# Patient Record
Sex: Male | Born: 1990 | Race: Black or African American | Hispanic: No | Marital: Single | Smoking: Never smoker
Health system: Southern US, Community
[De-identification: ages and names within clinical notes are randomized; demographics above are authoritative.]

---
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
## 2009-01-20 ENCOUNTER — Ambulatory Visit: Payer: Self-pay | Admitting: Internal Medicine

## 2009-01-20 DIAGNOSIS — F909 Attention-deficit hyperactivity disorder, unspecified type: Secondary | ICD-10-CM | POA: Insufficient documentation

## 2009-01-20 DIAGNOSIS — IMO0002 Reserved for concepts with insufficient information to code with codable children: Secondary | ICD-10-CM

## 2009-01-20 DIAGNOSIS — H547 Unspecified visual loss: Secondary | ICD-10-CM | POA: Insufficient documentation

## 2009-01-20 DIAGNOSIS — F79 Unspecified intellectual disabilities: Secondary | ICD-10-CM

## 2009-01-20 LAB — CONVERTED CEMR LAB
ALT: 21 units/L (ref 0–53)
AST: 31 units/L (ref 0–37)
Albumin: 4 g/dL (ref 3.5–5.2)
Alkaline Phosphatase: 83 units/L (ref 39–117)
BUN: 22 mg/dL (ref 6–23)
Basophils Absolute: 0 10*3/uL (ref 0.0–0.1)
Basophils Relative: 0 % (ref 0–1)
Bilirubin Urine: NEGATIVE
Blood in Urine, dipstick: NEGATIVE
CO2: 25 meq/L (ref 19–32)
Calcium: 9.2 mg/dL (ref 8.4–10.5)
Chloride: 104 meq/L (ref 96–112)
Cholesterol: 166 mg/dL (ref 0–169)
Creatinine, Ser: 0.99 mg/dL (ref 0.40–1.50)
Eosinophils Absolute: 0.2 10*3/uL (ref 0.0–0.7)
Eosinophils Relative: 3 % (ref 0–5)
Glucose, Bld: 83 mg/dL (ref 70–99)
Glucose, Urine, Semiquant: NEGATIVE
HCT: 39.4 % (ref 39.0–52.0)
HDL: 59 mg/dL (ref >34)
Hemoglobin: 13 g/dL (ref 13.0–17.0)
LDL Cholesterol: 72 mg/dL (ref 0–109)
Lymphocytes Relative: 33 % (ref 12–46)
Lymphs Abs: 1.6 10*3/uL (ref 0.7–4.0)
MCHC: 33 g/dL (ref 30.0–36.0)
MCV: 83.5 fL (ref 78.0–100.0)
Monocytes Absolute: 0.4 10*3/uL (ref 0.1–1.0)
Monocytes Relative: 9 % (ref 3–12)
Neutro Abs: 2.6 10*3/uL (ref 1.7–7.7)
Neutrophils Relative %: 54 % (ref 43–77)
Nitrite: NEGATIVE
Platelets: 130 10*3/uL — ABNORMAL LOW (ref 150–400)
Potassium: 4.6 meq/L (ref 3.5–5.3)
Protein, U semiquant: NEGATIVE
RBC: 4.72 M/uL (ref 4.22–5.81)
RDW: 13.7 % (ref 11.5–15.5)
Sodium: 138 meq/L (ref 135–145)
Specific Gravity, Urine: 1.015
Total Bilirubin: 0.4 mg/dL (ref 0.3–1.2)
Total CHOL/HDL Ratio: 2.8
Total Protein: 7.4 g/dL (ref 6.0–8.3)
Triglycerides: 174 mg/dL — ABNORMAL HIGH (ref <150)
Urobilinogen, UA: 1
VLDL: 35 mg/dL (ref 0–40)
WBC Urine, dipstick: NEGATIVE
WBC: 4.8 10*3/uL (ref 4.0–10.5)
pH: 7

## 2009-01-31 ENCOUNTER — Encounter (INDEPENDENT_AMBULATORY_CARE_PROVIDER_SITE_OTHER): Payer: Self-pay | Admitting: Internal Medicine

## 2009-06-19 ENCOUNTER — Telehealth (INDEPENDENT_AMBULATORY_CARE_PROVIDER_SITE_OTHER): Payer: Self-pay | Admitting: Internal Medicine

## 2009-06-20 ENCOUNTER — Emergency Department (HOSPITAL_COMMUNITY): Admission: EM | Admit: 2009-06-20 | Discharge: 2009-06-20 | Payer: Self-pay | Admitting: Emergency Medicine

## 2009-07-05 ENCOUNTER — Encounter (INDEPENDENT_AMBULATORY_CARE_PROVIDER_SITE_OTHER): Payer: Self-pay | Admitting: Internal Medicine

## 2010-04-03 ENCOUNTER — Ambulatory Visit: Payer: Self-pay | Admitting: Internal Medicine

## 2010-04-05 ENCOUNTER — Ambulatory Visit: Payer: Self-pay | Admitting: Internal Medicine

## 2010-04-27 ENCOUNTER — Emergency Department (HOSPITAL_COMMUNITY): Admission: EM | Admit: 2010-04-27 | Discharge: 2010-04-27 | Payer: Self-pay | Admitting: Emergency Medicine

## 2010-07-13 ENCOUNTER — Ambulatory Visit: Admit: 2010-07-13 | Payer: Self-pay | Admitting: Internal Medicine

## 2010-08-02 NOTE — Letter (Signed)
Summary: LETTERS OF APPOINTMENT GUARDIAN OF THE PERSON  LETTERS OF APPOINTMENT GUARDIAN OF THE PERSON   Imported By: Arta Bruce 04/03/2010 09:58:29  _____________________________________________________________________  External Attachment:    Type:   Image     Comment:   External Document

## 2010-08-02 NOTE — Letter (Signed)
Summary: LETTER OF APPOINTMENT GUARDIAN OF THE PERSON  LETTER OF APPOINTMENT GUARDIAN OF THE PERSON   Imported By: Arta Bruce 07/09/2010 15:29:59  _____________________________________________________________________  External Attachment:    Type:   Image     Comment:   External Document

## 2010-08-28 ENCOUNTER — Telehealth (INDEPENDENT_AMBULATORY_CARE_PROVIDER_SITE_OTHER): Payer: Self-pay | Admitting: Internal Medicine

## 2010-09-12 ENCOUNTER — Encounter (INDEPENDENT_AMBULATORY_CARE_PROVIDER_SITE_OTHER): Payer: Self-pay | Admitting: Internal Medicine

## 2010-09-18 NOTE — Letter (Signed)
Summary: Generic Letter  [facility]  [address] [place], [place] [address]   Phone: [phone]  Fax: [phone]      09/12/2010  Demetria Powell-Harrison, SWPS Guardianship, Adult [facility] Department of Social Services [address] [place], [place]  [address]  Re:  Kyle Luppino, DOB  1991/05/30  Dear Ms. Powell-Harrison,  I have made several attempts to contact you by telephone regarding Mr. Kluth.    Per Dr. Julieanne Manson, she has not seen this patient since 01/20/2009 and cannot fill out the "Adult Care Home Personal Care Physician Authorization and Care Plan" forms.  If he needs this done, he needs an office visit and the paperwork needs to be brought back at that time for completion.  He should also be seen yearly for a complete physical examination.   Please contact us if you have any further questions or to set up an appointment for Mr. Bollard.  Sincerely,   Dutch Quint RN

## 2010-09-18 NOTE — Progress Notes (Signed)
Summary: Form unable to be completed  Phone Note Other Incoming   Summary of Call: Received Adult Care Home Personal Care physician authorization and care plan paperwork from [facility], SWPS.  I have not see Tammy since  01/20/2009 and cannot fill out form. If needs this done--needs OV and to bring paperwork back at time of visit. He should be seen yearly for a physical. They should come pick up the paperwork so it does not get lost. Initial call taken by: Julieanne Manson MD,  August 28, 2010 11:46 AM  Follow-up for Phone Call        Called Ms. Powell-Harrison at [phone].  Left message on answering machine for her to return call. Dutch Quint RN  September 03, 2010 12:14 PM  Called Ms. Powell-Harrison at [phone].  Left message on answering machine for her to return call.  Dutch Quint RN  September 04, 2010 1:00 PM  Called and left message on answering machine for a supervisor, Marin Comment - [phone] to return my call.  Dutch Quint RN  September 10, 2010 12:43 PM  I have called several times, no response.  Do you want his forms mailed back to them?  Dutch Quint RN  September 11, 2010 2:40 PM  Yes with my instructions above.  Julieanne Manson MD  September 11, 2010 2:41 PM   Noted.  Letter sent with forms.  Dutch Quint RN  September 12, 2010 3:49 PM

## 2010-10-01 LAB — RAPID URINE DRUG SCREEN, HOSP PERFORMED
Amphetamines: NOT DETECTED
Barbiturates: NOT DETECTED
Benzodiazepines: NOT DETECTED
Opiates: NOT DETECTED

## 2010-10-01 LAB — ETHANOL: Alcohol, Ethyl (B): <5 mg/dL (ref 0–10)

## 2010-10-01 LAB — COMPREHENSIVE METABOLIC PANEL
BUN: 16 mg/dL (ref 6–23)
Calcium: 9.2 mg/dL (ref 8.4–10.5)
Glucose, Bld: 102 mg/dL — ABNORMAL HIGH (ref 70–99)
Sodium: 140 mEq/L (ref 135–145)
Total Protein: 7.5 g/dL (ref 6.0–8.3)

## 2010-10-01 LAB — DIFFERENTIAL
Basophils Absolute: 0 10*3/uL (ref 0.0–0.1)
Basophils Relative: 1 % (ref 0–1)
Eosinophils Absolute: 0.1 10*3/uL (ref 0.0–0.7)
Eosinophils Relative: 2 % (ref 0–5)
Lymphocytes Relative: 31 % (ref 12–46)
Lymphs Abs: 1.5 10*3/uL (ref 0.7–4.0)
Monocytes Absolute: 0.5 10*3/uL (ref 0.1–1.0)
Monocytes Relative: 11 % (ref 3–12)
Neutro Abs: 2.6 10*3/uL (ref 1.7–7.7)
Neutrophils Relative %: 55 % (ref 43–77)

## 2010-10-01 LAB — URINALYSIS, ROUTINE W REFLEX MICROSCOPIC
Glucose, UA: NEGATIVE mg/dL
Hgb urine dipstick: NEGATIVE
Leukocytes, UA: NEGATIVE
Nitrite: NEGATIVE
Specific Gravity, Urine: 1.034 — ABNORMAL HIGH (ref 1.005–1.030)
Urobilinogen, UA: 2 mg/dL — ABNORMAL HIGH (ref 0.0–1.0)
pH: 7 (ref 5.0–8.0)

## 2010-10-01 LAB — URINE MICROSCOPIC-ADD ON

## 2010-10-01 LAB — CBC
Hemoglobin: 12.7 g/dL — ABNORMAL LOW (ref 13.0–17.0)
MCHC: 33.2 g/dL (ref 30.0–36.0)
Platelets: 129 10*3/uL — ABNORMAL LOW (ref 150–400)
RDW: 12.6 % (ref 11.5–15.5)

## 2010-10-09 LAB — URINALYSIS, ROUTINE W REFLEX MICROSCOPIC
Bilirubin Urine: NEGATIVE
Glucose, UA: NEGATIVE mg/dL
Hgb urine dipstick: NEGATIVE
Ketones, ur: NEGATIVE mg/dL
Nitrite: NEGATIVE
Protein, ur: NEGATIVE mg/dL
Specific Gravity, Urine: 1.024 (ref 1.005–1.030)
Urobilinogen, UA: 2 mg/dL — ABNORMAL HIGH (ref 0.0–1.0)

## 2010-10-09 LAB — CBC
HCT: 40.3 % (ref 39.0–52.0)
HCT: 40.3 % (ref 39.0–52.0)
Hemoglobin: 13.5 g/dL (ref 13.0–17.0)
Hemoglobin: 13.6 g/dL (ref 13.0–17.0)
MCHC: 33.7 g/dL (ref 30.0–36.0)
MCV: 84 fL (ref 78.0–100.0)
MCV: 84 fL (ref 78.0–100.0)
Platelets: 107 10*3/uL — ABNORMAL LOW (ref 150–400)
RDW: 13.4 % (ref 11.5–15.5)
RDW: 13.7 % (ref 11.5–15.5)
WBC: 12.9 10*3/uL — ABNORMAL HIGH (ref 4.0–10.5)

## 2010-10-09 LAB — DIFFERENTIAL
Basophils Absolute: 0 10*3/uL (ref 0.0–0.1)
Basophils Relative: 0 % (ref 0–1)
Basophils Relative: 0 % (ref 0–1)
Eosinophils Absolute: 0.2 10*3/uL (ref 0.0–0.7)
Monocytes Absolute: 0.5 10*3/uL (ref 0.1–1.0)
Monocytes Absolute: 0.6 10*3/uL (ref 0.1–1.0)
Monocytes Relative: 5 % (ref 3–12)
Neutro Abs: 10.8 10*3/uL — ABNORMAL HIGH (ref 1.7–7.7)
Neutrophils Relative %: 84 % — ABNORMAL HIGH (ref 43–77)

## 2010-10-09 LAB — RAPID URINE DRUG SCREEN, HOSP PERFORMED
Amphetamines: NOT DETECTED
Amphetamines: NOT DETECTED
Barbiturates: NOT DETECTED
Benzodiazepines: NOT DETECTED
Benzodiazepines: NOT DETECTED
Cocaine: NOT DETECTED
Opiates: NOT DETECTED
Tetrahydrocannabinol: NOT DETECTED

## 2010-10-09 LAB — POCT I-STAT, CHEM 8
BUN: 6 mg/dL (ref 6–23)
BUN: 7 mg/dL (ref 6–23)
BUN: 7 mg/dL (ref 6–23)
Calcium, Ion: 1.02 mmol/L — ABNORMAL LOW (ref 1.12–1.32)
Calcium, Ion: 1.13 mmol/L (ref 1.12–1.32)
Calcium, Ion: 1.18 mmol/L (ref 1.12–1.32)
Chloride: 107 mEq/L (ref 96–112)
Chloride: 111 mEq/L (ref 96–112)
Chloride: 115 mEq/L — ABNORMAL HIGH (ref 96–112)
Creatinine, Ser: 1.1 mg/dL (ref 0.4–1.5)
Creatinine, Ser: 1.1 mg/dL (ref 0.4–1.5)
Glucose, Bld: 105 mg/dL — ABNORMAL HIGH (ref 70–99)
Glucose, Bld: 108 mg/dL — ABNORMAL HIGH (ref 70–99)
Glucose, Bld: 87 mg/dL (ref 70–99)
HCT: 48 % (ref 39.0–52.0)
HCT: 49 % (ref 39.0–52.0)
Hemoglobin: 16.3 g/dL (ref 13.0–17.0)
Hemoglobin: 16.7 g/dL (ref 13.0–17.0)
Potassium: 3.7 mEq/L (ref 3.5–5.1)
Potassium: 4 mEq/L (ref 3.5–5.1)
Potassium: 4.1 mEq/L (ref 3.5–5.1)
Sodium: 144 mEq/L (ref 135–145)
Sodium: 146 mEq/L — ABNORMAL HIGH (ref 135–145)
TCO2: 28 mmol/L (ref 0–100)
TCO2: 28 mmol/L (ref 0–100)

## 2010-10-09 LAB — T4, FREE: Free T4: 0.83 ng/dL (ref 0.80–1.80)

## 2010-10-09 LAB — CSF CELL COUNT WITH DIFFERENTIAL
RBC Count, CSF: 52 /mm3 — ABNORMAL HIGH
Tube #: 1

## 2010-10-09 LAB — HSV PCR: HSV, PCR: NOT DETECTED

## 2010-10-09 LAB — BASIC METABOLIC PANEL
CO2: 25 mEq/L (ref 19–32)
Calcium: 8.8 mg/dL (ref 8.4–10.5)
Chloride: 109 mEq/L (ref 96–112)
Glucose, Bld: 102 mg/dL — ABNORMAL HIGH (ref 70–99)
Potassium: 4 mEq/L (ref 3.5–5.1)
Sodium: 138 mEq/L (ref 135–145)

## 2010-10-09 LAB — GLUCOSE, CAPILLARY
Glucose-Capillary: 102 mg/dL — ABNORMAL HIGH (ref 70–99)
Glucose-Capillary: 79 mg/dL (ref 70–99)

## 2010-10-09 LAB — ETHANOL
Alcohol, Ethyl (B): <5 mg/dL (ref 0–10)
Alcohol, Ethyl (B): <5 mg/dL (ref 0–10)

## 2010-10-09 LAB — PROTEIN AND GLUCOSE, CSF
Glucose, CSF: 56 mg/dL (ref 43–76)
Total  Protein, CSF: 29 mg/dL (ref 15–45)

## 2010-10-09 LAB — COMPREHENSIVE METABOLIC PANEL
ALT: 16 U/L (ref 0–53)
Albumin: 3.3 g/dL — ABNORMAL LOW (ref 3.5–5.2)
Alkaline Phosphatase: 71 U/L (ref 39–117)
Alkaline Phosphatase: 72 U/L (ref 39–117)
BUN: 7 mg/dL (ref 6–23)
CO2: 23 mEq/L (ref 19–32)
Chloride: 112 mEq/L (ref 96–112)
GFR calc Af Amer: >60 mL/min (ref >60)
GFR calc non Af Amer: >60 mL/min (ref >60)
Glucose, Bld: 102 mg/dL — ABNORMAL HIGH (ref 70–99)
Potassium: 4.1 mEq/L (ref 3.5–5.1)
Potassium: 4.4 mEq/L (ref 3.5–5.1)
Sodium: 144 mEq/L (ref 135–145)
Total Bilirubin: 1.2 mg/dL (ref 0.3–1.2)
Total Protein: 6.3 g/dL (ref 6.0–8.3)

## 2010-10-09 LAB — VALPROIC ACID LEVEL
Valproic Acid Lvl: 138.5 ug/mL — ABNORMAL HIGH (ref 50.0–100.0)
Valproic Acid Lvl: 59.7 ug/mL (ref 50.0–100.0)
Valproic Acid Lvl: 98.1 ug/mL (ref 50.0–100.0)

## 2010-10-09 LAB — CSF CULTURE W GRAM STAIN: Gram Stain: NONE SEEN

## 2010-10-09 LAB — ACETAMINOPHEN LEVEL: Acetaminophen (Tylenol), Serum: <10 ug/mL — ABNORMAL LOW (ref 10–30)

## 2010-10-09 LAB — HEPATITIS PANEL, ACUTE
Hep A IgM: NEGATIVE
Hep B C IgM: NEGATIVE

## 2010-11-13 NOTE — Procedures (Signed)
EEG NUMBER:  [id number].   CLINICAL HISTORY:  An 20 year old mentally retarded male being evaluated  for aggressive behavior and altered mental status.   MEDICATIONS:  Depakote, aripiprazole, Lovenox, Protonix, Zofran.   This is a portable EEG recorded with the patient awake and drowsy using  standard 10/20 electrode placement.   Background awake rhythm consists of 4-5 Hz theta which is diffusely  slow, symmetric.  Intermittent 3-4 Hz transient delta slowing is seen  bilaterally in a diffuse distribution.  No paroxysmal and epileptiform  activities, spikes or sharp waves are noted.  Changes of drowsiness are  achieved towards the end of the tracing.  The length of the recording is  21.9 minutes.  Technical component is adequate.  EKG tracing reveals  regular sinus rhythm.  Hyperventilation and photic stimulation are not  performed this being a portable study.   IMPRESSION:  This EEG performed during awake and drowsy state is  abnormal due to presence of moderate bihemispheric slowing, this is a  nonspecific finding seen in a variety of toxic metabolic infectious  etiologies.  No definite epileptiform features are noted.           ______________________________  Sunny Schlein. Pearlean Brownie, MD     ZOX:WRUE  D:  11/18/2008 16:00:43  T:  11/19/2008 08:07:41  Job #:  [id number]   cc:   [facility]

## 2010-11-13 NOTE — H&P (Signed)
NAMECON, ARGANBRIGHT NO.:  [id number]   MEDICAL RECORD NO.:  [id number]          PATIENT TYPE:  INP   LOCATION:  1234                         FACILITY:  [facility]   PHYSICIAN:  Vania Rea, M.D. DATE OF BIRTH:  May 18, 1991   DATE OF ADMISSION:  11/18/2008  DATE OF DISCHARGE:                              HISTORY & PHYSICAL   PRIMARY CARE PHYSICIAN:  [facility].   CHIEF COMPLAINT:  Progressive somnolence.   HISTORY OF PRESENT ILLNESS:  This is an 20 year old mentally retarded  African American boy who has visited the [facility] emergency room 3  times already within the past 3 days.  This is his fourth visit.  The  patient resides at a group home and his medications include Abilify and  Depakote, and he is usually very active and aggressive, but over the  past 3 days developed vomiting and diarrhea , visited the [facility]  emergency room 3 days ago, was treated with Zofran, had blood work done  at which time he was noted to have elevated level of Depakote; was  hydrated, sent home, but returned the following day with persistent  vomiting. He was hydrated again,  his Depakote level was checked, and it  was now normal,  he was sent home again.  Returned yesterday afternoon  to [facility] drowsy and still with nausea.  He was hydrated once again,  and his mental status improved to baseline.  However, he is brought in  to [facility] emergency room tonight with extreme somnolence and  difficulty with arousable.  The patient has been discussed with his  caregiver at the nursing facility, and she has indicated that he has had  none of his medications for the past 24 hours and he is taking no  sedating medications.  Of note the patient received only Zofran in the  emergency room.  He received no Phenergan or any other sedating  medications.  The patient has had no fever nor headache.  No chest pains  as noted.  Because of the vomiting and diarrhea, he has become  also  incontinent of urine and stool.  Because the patient has presented with  altered mental status and no clear cause can be found, the hospitalist  service has been called to assist with management.  Because of patient's  mental status, no further review of systems can be done.  His  grandmother and father were with him.  Can contribute nothing further to  the history of presenting illness.   PAST MEDICAL HISTORY:  1. Mental retardation since birth.  His mother was a crack addict.  He      used to live at home with his grandmother, but as he became more      aggressive he was moved to a group home in February of this year.  2. Mental retardation.  3. Bipolar disorder.  4. Attention deficit disorder.   MEDICATIONS:  1. Abilify 30 mg at bedtime.  Last given on the night of May 19.  2. Depakote 500 mg twice daily which was discontinued as of yesterday.   ALLERGIES:  No known drug allergies.   SOCIAL HISTORY:  No history of tobacco, alcohol or illicit drug use.  Other information as  above.   FAMILY HISTORY:  His father and grandmother are hypertensive.  His  mother is a crack addict, and she has not been in contact with them.   REVIEW OF SYSTEMS:  Other than noted above unable to elicit any other  review of systems.   PHYSICAL EXAMINATION:  A young African American boy lying in the bed,  looks young for his age.  Has a pose of sleeping.  Does not respond to  oral commands, but with deep pressure.  He opens his eyes and will turn,  but then immediately goes back into the fetal position.  He is  completely nonverbal at this time, although he is usually very  talkative.   VITALS:  His temperature is 98.6, pulse 69, respirations 18, blood  pressure 104/52.  He is saturating at 98% on room air.  He gives no indication of being in any pain.  His pupils are round, equal, and reactive.  His mucous membranes are  moist, pink, anicteric.  He has no cervical lymphadenopathy or  thyromegaly.  No jugular venous  distention.  NECK:  Supple.  CHEST:  Clear to auscultation bilaterally.  CARDIOVASCULAR:  Regular rhythm without murmur.  ABDOMEN:  Soft and nontender.  No masses are felt.  EXTREMITIES:  Are without edema.  He has 2+ dorsalis pedis pulses  bilaterally.  CENTRAL NERVOUS SYSTEM:  He moves all limbs equally.   LABORATORIES:  CBC:  White count is 9.6, hemoglobin 12.8, his platelet  count is low at 107, it was 119 yesterday.  He has 81% neutrophils.  His  absolute neutrophil count is slightly elevated at 7.8, but it is lower  than yesterday. His serum chemistry significant for sodium of 144,  potassium 4.1, chloride elevated to 114, CO2 22, glucose 19, BUN 7,  creatinine 1.03.  His AST is elevated at 88, ALT 16.  Alcohol level is  undetectable.  Lipase is normal.  Urine salicylate level is  undetectable.  Urine drug screen is negative for all tested  constituents.  CT scan of his brain negative for acute abnormality.  His  urinalysis shows dilute urine with a specific gravity of 1.008.   ASSESSMENT:  1. Altered mental status likely related to recent viral infection,      possibly persistence of psychotropic medication.  2. Thrombocytopenia likely related to recent viral infection, but      possibly medication-related.  Abnormal liver function tests      possibly due to recent viral infection, possibly hepatitis, but      likely medication-related.  3. Leukocytosis likely related to his infection.   PLAN:  Will bring this gentleman for evaluation of his extreme  somnolence.  Will hydrate him and manage expectantly from that point of  view, but will check a hepatitis panel, although his neck is supple,  because of his progressive deterioration and mental status changes, will  try to get a lumbar puncture to rule out a viral encephalopathy.      Vania Rea, M.D.  Electronically Signed     LC/MEDQ  D:  11/18/2008  T:  11/18/2008  Job:   [id number]   cc:   Blima Singer, M.D.

## 2010-11-13 NOTE — Discharge Summary (Signed)
NAMEWILLIAM, Vance            ACCOUNT NO.:  [id number]   MEDICAL RECORD NO.:  [id number]          PATIENT TYPE:  INP   LOCATION:  1512                         FACILITY:  [facility]   PHYSICIAN:  Theodosia Paling, MD    DATE OF BIRTH:  Sep 17, 1990   DATE OF ADMISSION:  11/18/2008  DATE OF DISCHARGE:  11/21/2008                               DISCHARGE SUMMARY   PRIMARY CARE PHYSICIAN:  [facility].   ADMITTING HISTORY:  Please refer to the excellent admission note  dictated by Dr. Vania Rea under history of present illness.   DISCHARGE DIAGNOSIS:  Altered mentation most likely secondary to  psychotropic medications, especially valproic overdose.   SECONDARY DIAGNOSES:  1. History of mental retardation since birth.  2. History of bipolar disorder.  3. History of attention deficit disorder.   DISCHARGE MEDICATIONS:  1. Depakote 500 mg p.o. daily.  2. Abilify 30 mg p.o. q.h.s.   HOSPITAL COURSE:  Following issues were addressed during the  hospitalization.  1. Altered mentation.  The patient underwent CT of the head on Nov 18, 2008 on admission.  There was no acute intracranial abnormality.  A      chest x-ray was also normal.  The patient underwent a lumbar      puncture and the CSF study was found to be normal.  X-ray of the      abdomen was also performed on May 21 which did not show any acute      abnormality.  His valproic level was elevated.  Depakote level was      reduced.  Subsequently the patient's confusion resolved.  At the      time of discharge, the patient is oriented x3 and is back to his      normal self according to the family.  2. Bipolar disorder.  Remained under control with Depakote.  3. Attention deficit disorder.  Remained controlled with Abilify.   IMAGING PERFORMED:  As mentioned in HPI.   PROCEDURE PERFORMED:  Lumbar puncture performed on Nov 18, 2008.   DISPOSITION:  Pending at this time.  The patient is going to go to  either a group  home or home with his father,  depending on what further  transpires as apparently the group home has been refusing to take him  back.  Our social workers are involved in this case currently.   Total time spent in discharge of this patient around 45 minutes.   CONSULTATION PERFORMED:  None.      Theodosia Paling, MD  Electronically Signed     NP/MEDQ  D:  11/21/2008  T:  11/21/2008  Job:  [id number]

## 2011-01-09 ENCOUNTER — Inpatient Hospital Stay: Payer: Self-pay | Admitting: Psychiatry

## 2011-05-20 IMAGING — CR DG FOOT COMPLETE 3+V*R*
3 series · 3 of 3 positions shown · non-contrast
Comparison: None.

CLINICAL DATA: .  Fall

RIGHT FOOT COMPLETE - 3+ VIEW

[t foot ap right]
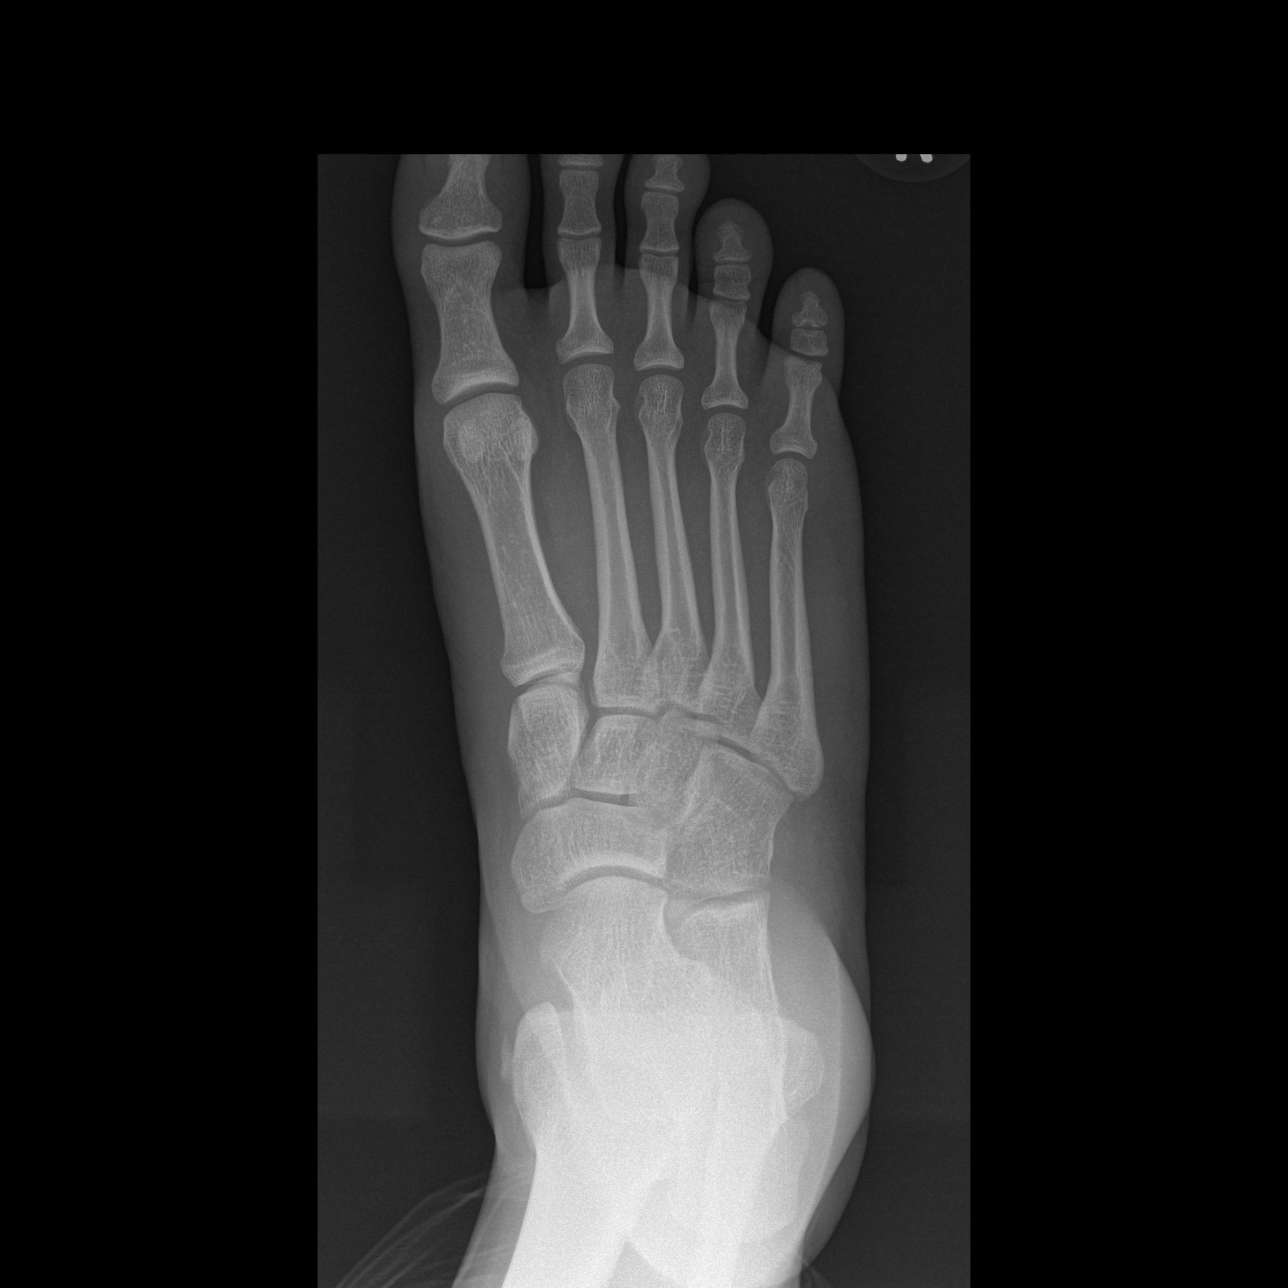

[t foot oblique right]
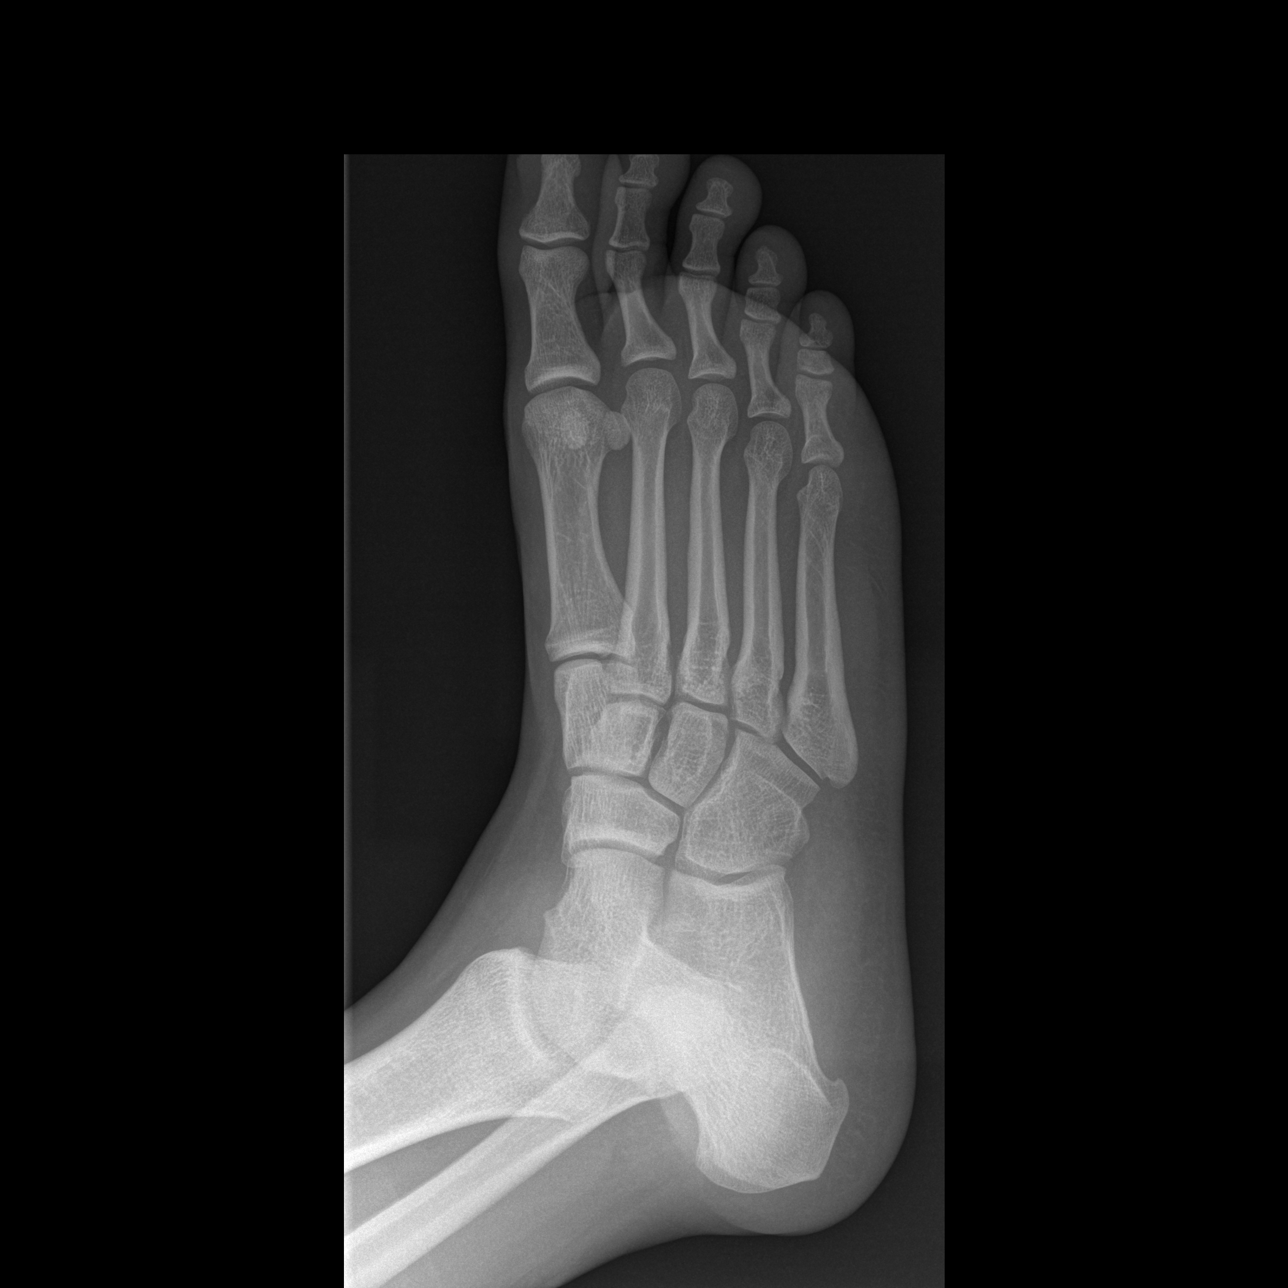

[t foot lat right]
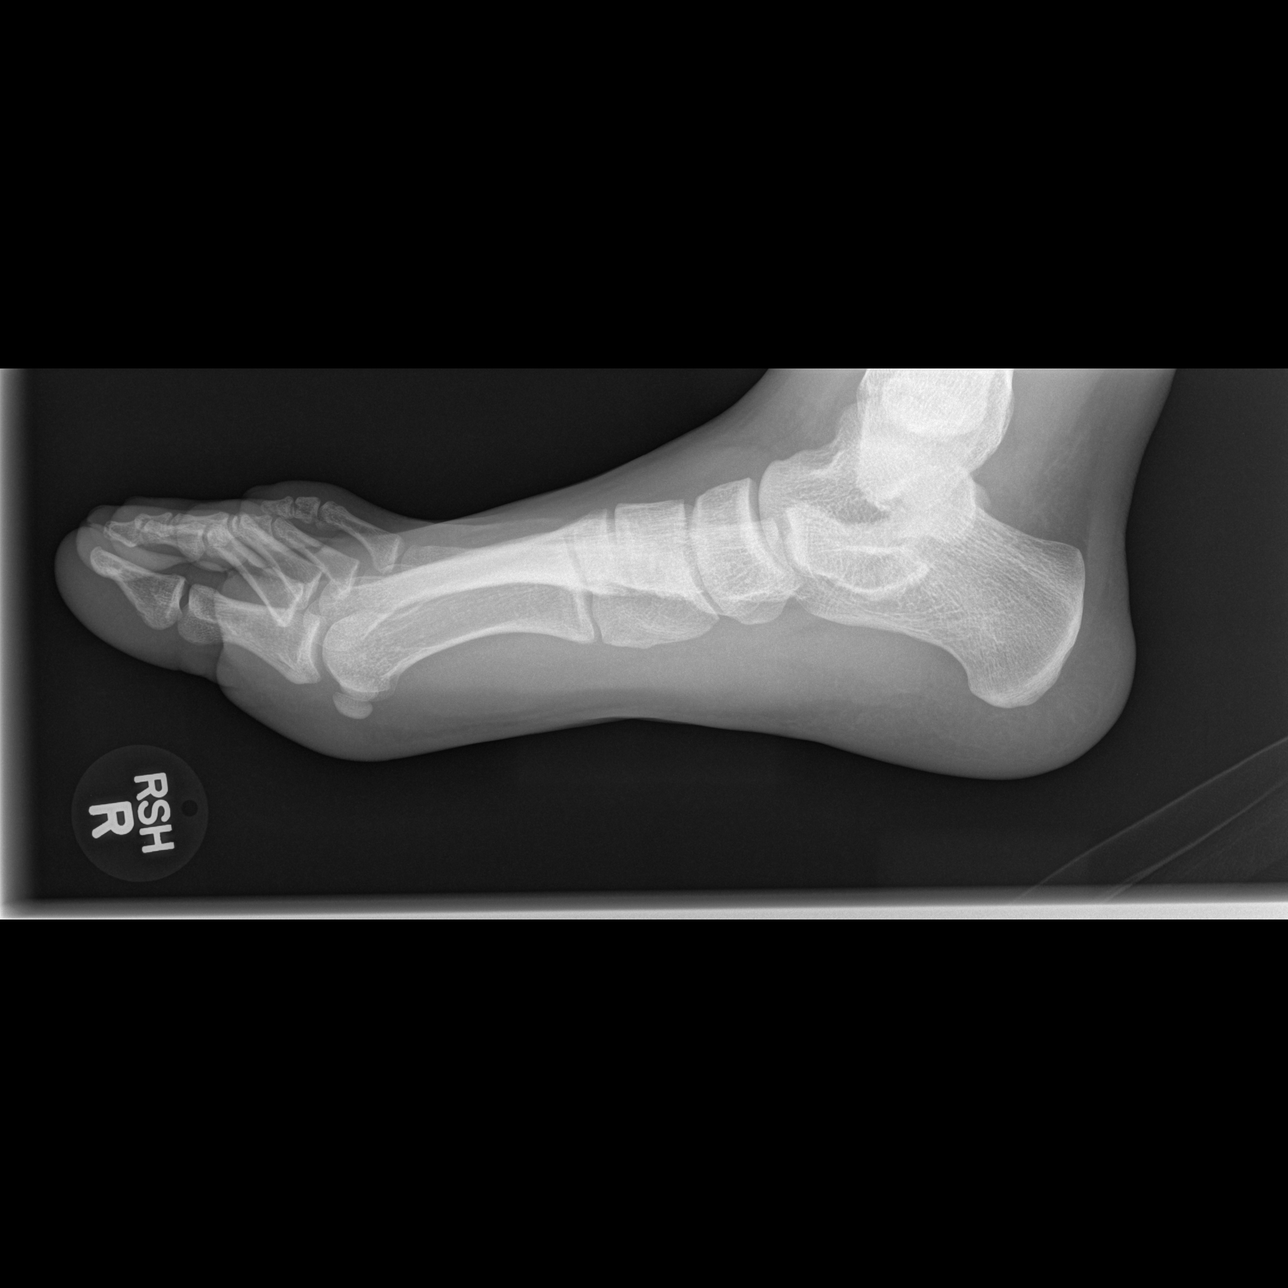

[3 of 3 positions shown; findings below may reference images not displayed]

FINDINGS: There is no evidence of fracture or dislocation.  There
is no evidence of arthropathy or other focal bone abnormality.
Soft tissues are unremarkable.
IMPRESSION: Negative.

## 2012-07-20 ENCOUNTER — Emergency Department: Payer: Self-pay | Admitting: Unknown Physician Specialty

## 2012-07-20 LAB — DRUG SCREEN, URINE
Barbiturates, Ur Screen: NEGATIVE (ref ?–200)
Benzodiazepine, Ur Scrn: NEGATIVE (ref ?–200)
Cannabinoid 50 Ng, Ur ~~LOC~~: NEGATIVE (ref ?–50)
MDMA (Ecstasy)Ur Screen: NEGATIVE (ref ?–500)
Opiate, Ur Screen: NEGATIVE (ref ?–300)
Phencyclidine (PCP) Ur S: NEGATIVE (ref ?–25)
Tricyclic, Ur Screen: NEGATIVE (ref ?–1000)

## 2012-07-20 LAB — COMPREHENSIVE METABOLIC PANEL
Albumin: 4.2 g/dL (ref 3.4–5.0)
Alkaline Phosphatase: 85 U/L (ref 50–136)
BUN: 13 mg/dL (ref 7–18)
Bilirubin,Total: 0.3 mg/dL (ref 0.2–1.0)
Creatinine: 0.92 mg/dL (ref 0.60–1.30)
EGFR (Non-African Amer.): >60
Glucose: 96 mg/dL (ref 65–99)
Potassium: 4 mmol/L (ref 3.5–5.1)
SGOT(AST): 24 U/L (ref 15–37)
SGPT (ALT): 18 U/L (ref 12–78)

## 2012-07-20 LAB — URINALYSIS, COMPLETE
Bacteria: NONE SEEN
Glucose,UR: NEGATIVE mg/dL (ref 0–75)
Ketone: NEGATIVE
WBC UR: 14 /HPF (ref 0–5)

## 2012-07-20 LAB — ACETAMINOPHEN LEVEL: Acetaminophen: <2 ug/mL

## 2012-07-20 LAB — CBC: WBC: 8.2 10*3/uL (ref 3.8–10.6)
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
## 2014-10-21 NOTE — Consult Note (Signed)
Brief Consult Note: Diagnosis: Bipolar Do NOS, Mild MR.   Patient was seen by consultant.   Consult note dictated.   Orders entered.   Comments: Started Carbamazepin 200mg  PO BID Seroquel XR 200mg  PO BID Tregretol level in AM Will get collateral info from guardian and dicuss about disposition.  Electronic Signatures: Rhunette CroftFaheem, Clydell Alberts S (MD)  (Signed 21-Jan-14 13:08)  Authored: Brief Consult Note   Last Updated: 21-Jan-14 13:08 by Rhunette CroftFaheem, Jaquaveon Bilal S (MD)

## 2024-01-11 ENCOUNTER — Other Ambulatory Visit: Payer: Self-pay

## 2024-01-11 ENCOUNTER — Encounter (HOSPITAL_BASED_OUTPATIENT_CLINIC_OR_DEPARTMENT_OTHER): Payer: Self-pay | Admitting: Emergency Medicine

## 2024-01-11 ENCOUNTER — Emergency Department (HOSPITAL_BASED_OUTPATIENT_CLINIC_OR_DEPARTMENT_OTHER)
Admission: EM | Admit: 2024-01-11 | Discharge: 2024-01-11 | Disposition: A | Discharge status: Home / Self Care | Payer: MEDICAID | Attending: Emergency Medicine | Admitting: Emergency Medicine

## 2024-01-11 DIAGNOSIS — H01004 Unspecified blepharitis left upper eyelid: Secondary | ICD-10-CM | POA: Diagnosis not present

## 2024-01-11 DIAGNOSIS — H5712 Ocular pain, left eye: Secondary | ICD-10-CM | POA: Diagnosis present

## 2024-01-11 MED ORDER — AMOXICILLIN-POT CLAVULANATE 875-125 MG PO TABS: 1.0000 | ORAL_TABLET | Freq: Two times a day (BID) | ORAL | 0 refills | Status: AC

## 2024-01-11 MED ORDER — ERYTHROMYCIN 5 MG/GM OP OINT
TOPICAL_OINTMENT | Freq: Four times a day (QID) | OPHTHALMIC | Status: DC
  Administered 2024-01-11: 1 via OPHTHALMIC
  Filled 2024-01-11: qty 3.5

## 2024-01-11 MED ORDER — AMOXICILLIN-POT CLAVULANATE 875-125 MG PO TABS
1.0000 | ORAL_TABLET | Freq: Once | ORAL | Status: AC
  Administered 2024-01-11: 1 via ORAL
  Filled 2024-01-11: qty 1

## 2024-01-11 NOTE — Discharge Instructions (Addendum)
 Take topical antibiotic every 6 hours for about 5 days, take oral antibiotic as prescribed.  Take your next dose tomorrow morning.  Please return if symptoms worsen otherwise follow-up with ophthalmology if not improving.  Recommend taking a hand towel with warm water and putting on your eyelid several times a day for several minutes at a time.

## 2024-01-11 NOTE — ED Provider Notes (Signed)
 [facility] Provider Note   CSN: [id number] Arrival date & time: 01/11/24  1912     Patient presents with: Eye Pain   Austin Vance is a 33 y.o. male.   Patient here with swelling to the left upper eyelid since yesterday.  Nothing makes it worse or better.  Denies any pain.  No pain with eye movement.  No trauma.  Does not wear contacts.  He denies any fevers or chills.  Denies any vision loss.  The history is provided by the patient.       Prior to Admission medications   Medication Sig Start Date End Date Taking? Authorizing Provider  amoxicillin -clavulanate (AUGMENTIN ) 875-125 MG tablet Take 1 tablet by mouth every 12 (twelve) hours. 01/11/24  Yes Kaitrin Seybold, DO    Allergies: Patient has no known allergies.    Review of Systems  Updated Vital Signs BP (!) 138/90 (BP Location: Right Arm)   Pulse 98   Temp 98.1 F (36.7 C)   Resp 20   SpO2 95%   Physical Exam Vitals and nursing note reviewed.  Constitutional:      General: He is not in acute distress.    Appearance: He is well-developed. He is not ill-appearing.  HENT:     Head: Normocephalic and atraumatic.     Nose: Nose normal.     Mouth/Throat:     Mouth: Mucous membranes are moist.  Eyes:     Extraocular Movements: Extraocular movements intact.     Conjunctiva/sclera: Conjunctivae normal.     Pupils: Pupils are equal, round, and reactive to light.     Comments: Mild swelling to the left upper eyelid, conjunctiva is normal, visual fields unremarkable,  pupils equal and reactive  Cardiovascular:     Rate and Rhythm: Normal rate and regular rhythm.     Heart sounds: No murmur heard. Pulmonary:     Effort: Pulmonary effort is normal. No respiratory distress.     Breath sounds: Normal breath sounds.  Abdominal:     Palpations: Abdomen is soft.     Tenderness: There is no abdominal tenderness.  Musculoskeletal:        General: No swelling.      Cervical back: Neck supple.  Skin:    General: Skin is warm and dry.     Capillary Refill: Capillary refill takes less than 2 seconds.  Neurological:     Mental Status: He is alert.  Psychiatric:        Mood and Affect: Mood normal.     (all labs ordered are listed, but only abnormal results are displayed) Labs Reviewed - No data to display  EKG: None  Radiology: No results found.   Procedures   Medications Ordered in the ED  erythromycin  ophthalmic ointment (has no administration in time range)  amoxicillin -clavulanate (AUGMENTIN ) 875-125 MG per tablet 1 tablet (has no administration in time range)                                    Medical Decision Making Risk Prescription drug management.   Austin Vance is here inflammation to left upper eyelid.  Normal vitals.  No fever.  I do not appreciate any major signs of a preseptal cellulitis but will treat with Augmentin .  Have no concern for orbital cellulitis.  He is not having any pain.  Have no concern for  cardial abrasion or glaucoma or other acute eye problem.  It looks like he has inflammation of the upper eyelid likely from a stye.  Recommend warm compresses will prescribe erythromycin  eye ointment and Augmentin .  Will have him follow-up with ophthalmology.  Told to return if symptoms worsen.  This chart was dictated using voice recognition software.  Despite best efforts to proofread,  errors can occur which can change the documentation meaning.      Final diagnoses:  Blepharitis of left upper eyelid, unspecified type    ED Discharge Orders          Ordered    amoxicillin -clavulanate (AUGMENTIN ) 875-125 MG tablet  Every 12 hours        01/11/24 1930               Ruthe Cornet, DO 01/11/24 1932

## 2024-01-11 NOTE — ED Triage Notes (Signed)
 Pt with pain and swelling to LT eyelid since yesterday

## 2024-03-19 ENCOUNTER — Encounter (HOSPITAL_BASED_OUTPATIENT_CLINIC_OR_DEPARTMENT_OTHER): Payer: Self-pay | Admitting: Emergency Medicine

## 2024-03-19 ENCOUNTER — Other Ambulatory Visit: Payer: Self-pay

## 2024-03-19 DIAGNOSIS — R1110 Vomiting, unspecified: Secondary | ICD-10-CM | POA: Diagnosis present

## 2024-03-19 DIAGNOSIS — Z5321 Procedure and treatment not carried out due to patient leaving prior to being seen by health care provider: Secondary | ICD-10-CM | POA: Insufficient documentation

## 2024-03-19 NOTE — ED Triage Notes (Signed)
 Patient reports 2 episodes of vomiting after eating japanese food tonight.

## 2024-03-20 ENCOUNTER — Emergency Department (HOSPITAL_BASED_OUTPATIENT_CLINIC_OR_DEPARTMENT_OTHER)
Admission: EM | Admit: 2024-03-20 | Discharge: 2024-03-20 | Discharge status: Against Medical Advice | Payer: MEDICAID | Attending: Emergency Medicine | Admitting: Emergency Medicine

## 2024-03-20 NOTE — ED Notes (Signed)
Called 3x for room placement. Eloped from waiting area.
